# Patient Record
Sex: Female | Born: 1945 | Race: White | Hispanic: No | Marital: Married | Smoking: Never smoker
Health system: Southern US, Community
[De-identification: ages and names within clinical notes are randomized; demographics above are authoritative.]

## PROBLEM LIST (undated history)

## (undated) DIAGNOSIS — I10 Essential (primary) hypertension: Secondary | ICD-10-CM

## (undated) HISTORY — PX: LEG SURGERY: SHX1003

---

## 2017-03-14 ENCOUNTER — Other Ambulatory Visit: Payer: Self-pay | Admitting: Internal Medicine

## 2017-03-14 DIAGNOSIS — Z1231 Encounter for screening mammogram for malignant neoplasm of breast: Secondary | ICD-10-CM

## 2017-06-26 ENCOUNTER — Encounter: Payer: Self-pay | Admitting: Radiology

## 2017-06-26 ENCOUNTER — Ambulatory Visit
Admission: RE | Admit: 2017-06-26 | Discharge: 2017-06-26 | Disposition: A | Discharge status: Home / Self Care | Payer: Medicare Other | Source: Ambulatory Visit | Attending: Internal Medicine | Admitting: Internal Medicine

## 2017-06-26 DIAGNOSIS — Z1231 Encounter for screening mammogram for malignant neoplasm of breast: Secondary | ICD-10-CM | POA: Insufficient documentation

## 2017-07-04 ENCOUNTER — Inpatient Hospital Stay
Admission: RE | Admit: 2017-07-04 | Discharge: 2017-07-04 | Disposition: A | Discharge status: Home / Self Care | Payer: Self-pay | Source: Ambulatory Visit | Attending: *Deleted | Admitting: *Deleted

## 2017-07-04 ENCOUNTER — Other Ambulatory Visit: Payer: Self-pay | Admitting: *Deleted

## 2017-07-04 DIAGNOSIS — Z9289 Personal history of other medical treatment: Secondary | ICD-10-CM

## 2018-08-03 ENCOUNTER — Other Ambulatory Visit: Payer: Self-pay | Admitting: Internal Medicine

## 2018-08-03 DIAGNOSIS — Z1231 Encounter for screening mammogram for malignant neoplasm of breast: Secondary | ICD-10-CM

## 2018-09-14 ENCOUNTER — Other Ambulatory Visit: Payer: Self-pay

## 2018-09-14 ENCOUNTER — Ambulatory Visit
Admission: RE | Admit: 2018-09-14 | Discharge: 2018-09-14 | Disposition: A | Discharge status: Home / Self Care | Payer: Medicare Other | Source: Ambulatory Visit | Attending: Internal Medicine | Admitting: Internal Medicine

## 2018-09-14 DIAGNOSIS — Z1231 Encounter for screening mammogram for malignant neoplasm of breast: Secondary | ICD-10-CM | POA: Insufficient documentation

## 2019-03-23 ENCOUNTER — Ambulatory Visit: Payer: Medicare Other

## 2019-04-01 ENCOUNTER — Ambulatory Visit: Payer: Medicare Other | Attending: Internal Medicine

## 2019-04-01 DIAGNOSIS — Z23 Encounter for immunization: Secondary | ICD-10-CM | POA: Insufficient documentation

## 2019-04-01 NOTE — Progress Notes (Signed)
   Covid-19 Vaccination Clinic  Name:  Robin Douglas    MRN: [id number] DOB: 15-Jul-1945  04/01/2019  Robin Douglas was observed post Covid-19 immunization for 15 minutes without incidence. She was provided with Vaccine Information Sheet and instruction to access the V-Safe system.   Robin Douglas was instructed to call 911 with any severe reactions post vaccine: Marland Kitchen Difficulty breathing  . Swelling of your face and throat  . A fast heartbeat  . A bad rash all over your body  . Dizziness and weakness    Immunizations Administered    Name Date Dose VIS Date Route   Pfizer COVID-19 Vaccine 04/01/2019  5:35 PM 0.3 mL 02/05/2019 Intramuscular   Manufacturer: [employer], [employer]   Lot: [id number]   NDC: [address]-9

## 2019-04-09 ENCOUNTER — Ambulatory Visit: Payer: Medicare Other

## 2019-04-27 ENCOUNTER — Ambulatory Visit: Payer: Medicare Other | Attending: Internal Medicine

## 2019-04-27 DIAGNOSIS — Z23 Encounter for immunization: Secondary | ICD-10-CM | POA: Insufficient documentation

## 2019-04-27 NOTE — Progress Notes (Signed)
   Covid-19 Vaccination Clinic  Name:  Robin Douglas    MRN: [id number] DOB: 1945-11-17  04/27/2019  Ms. Robin Douglas was observed post Covid-19 immunization for 15 minutes without incident. She was provided with Vaccine Information Sheet and instruction to access the V-Safe system.   Ms. Robin Douglas was instructed to call 911 with any severe reactions post vaccine: Marland Kitchen Difficulty breathing  . Swelling of face and throat  . A fast heartbeat  . A bad rash all over body  . Dizziness and weakness   Immunizations Administered    Name Date Dose VIS Date Route   Pfizer COVID-19 Vaccine 04/27/2019  8:30 AM 0.3 mL 02/05/2019 Intramuscular   Manufacturer: [employer], [employer]   Lot: [id number]   NDC: [address]-4

## 2019-06-24 IMAGING — MG MM DIGITAL SCREENING BILAT W/ TOMO W/ CAD
8 series · 8 of 24 positions shown · non-contrast
Comparison: Previous exam(s).

CLINICAL DATA: Screening.

EXAM:
DIGITAL SCREENING BILATERAL MAMMOGRAM WITH TOMO AND CAD

[L CC synth-2D]
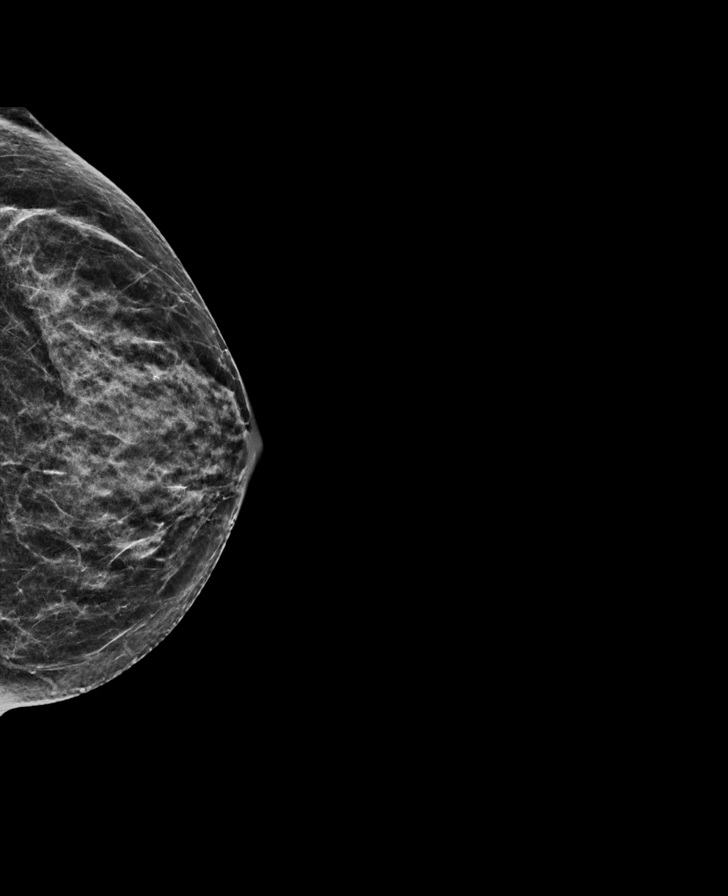

[R CC synth-2D]
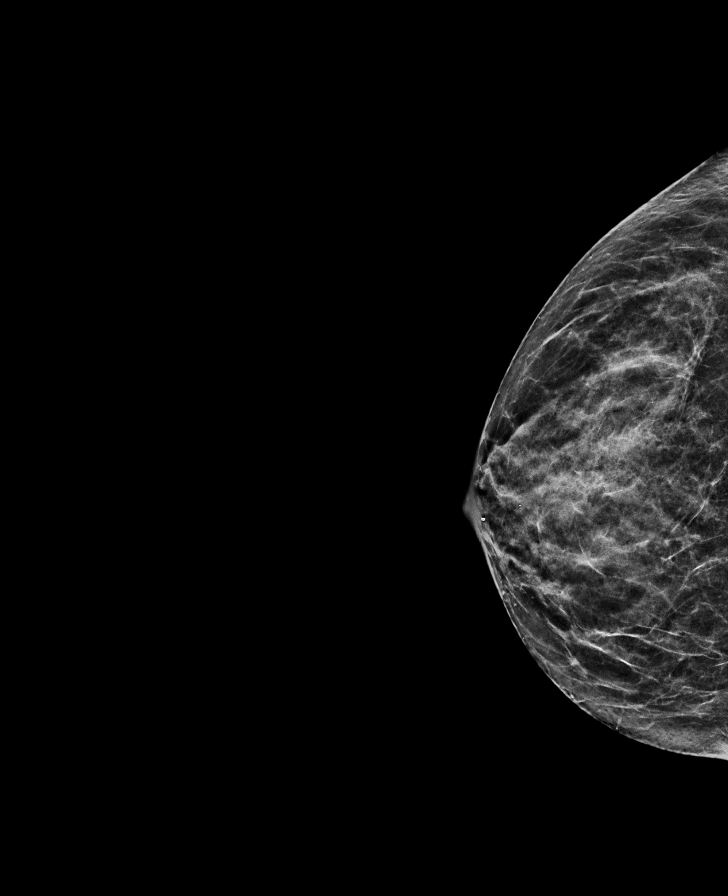

[R MLO synth-2D]
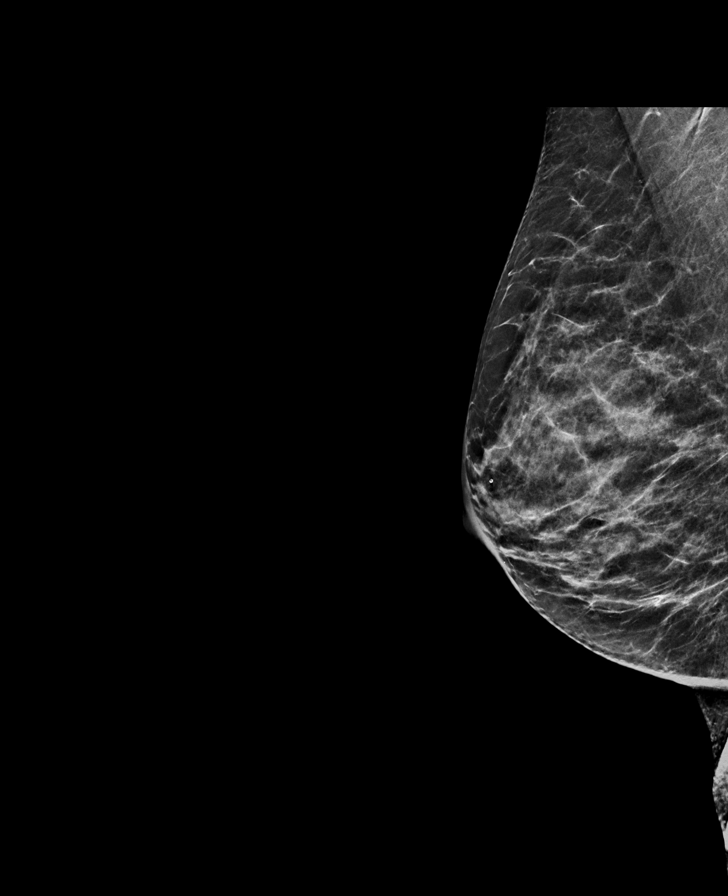

[L MLO synth-2D]
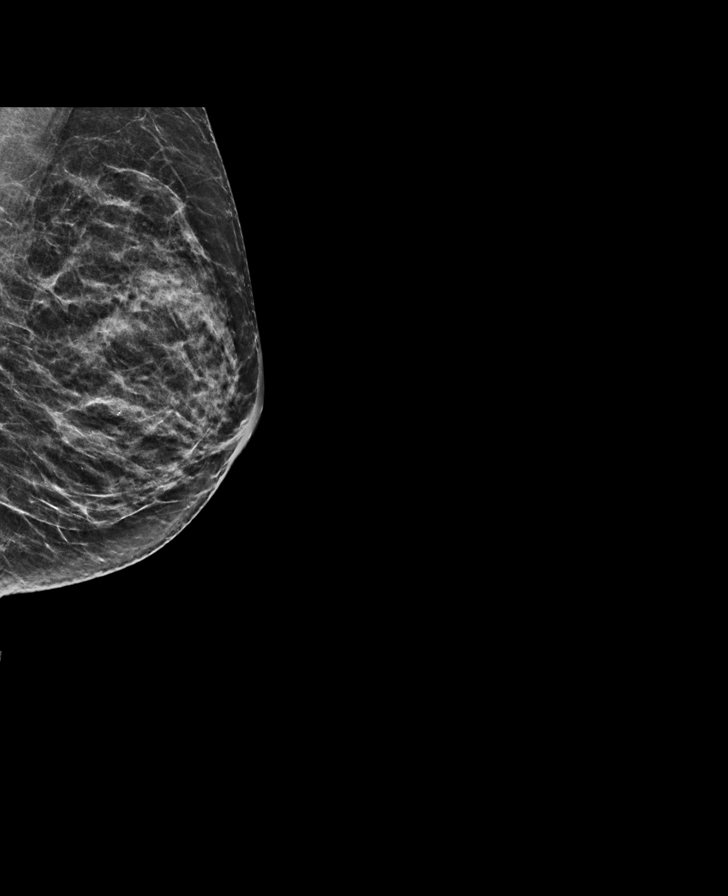

[R MLO tomo · tomo slice 27/53.0]
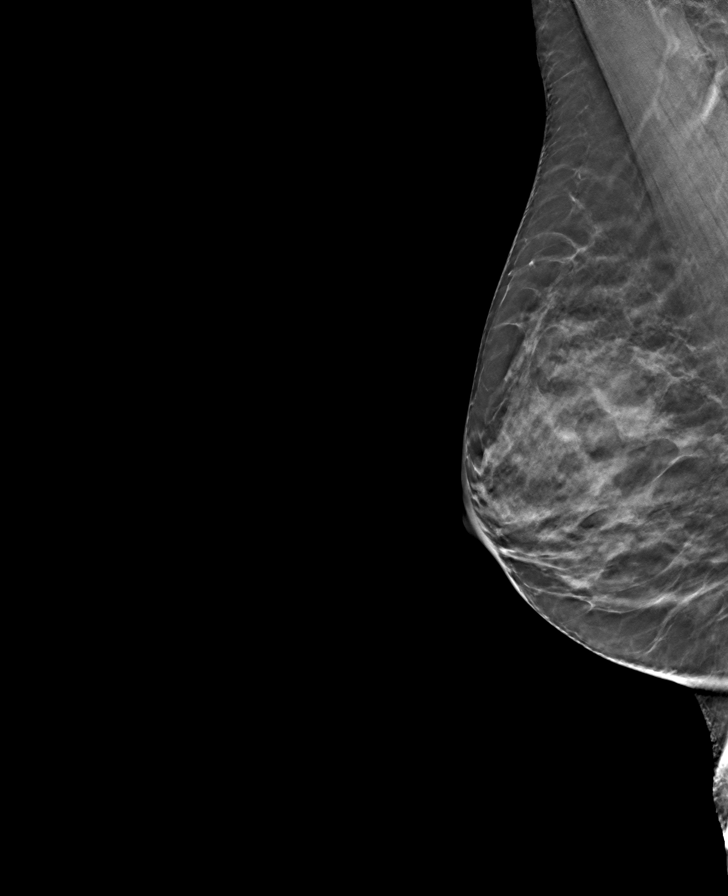

[L MLO tomo · tomo slice 26/51.0]
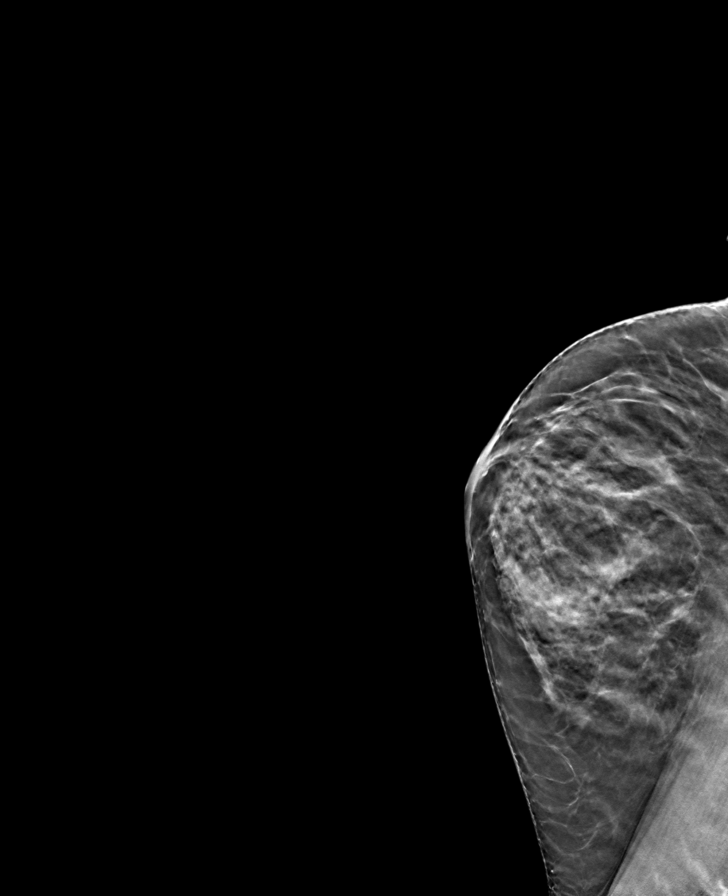

[L CC tomo · tomo slice 27/52.0]
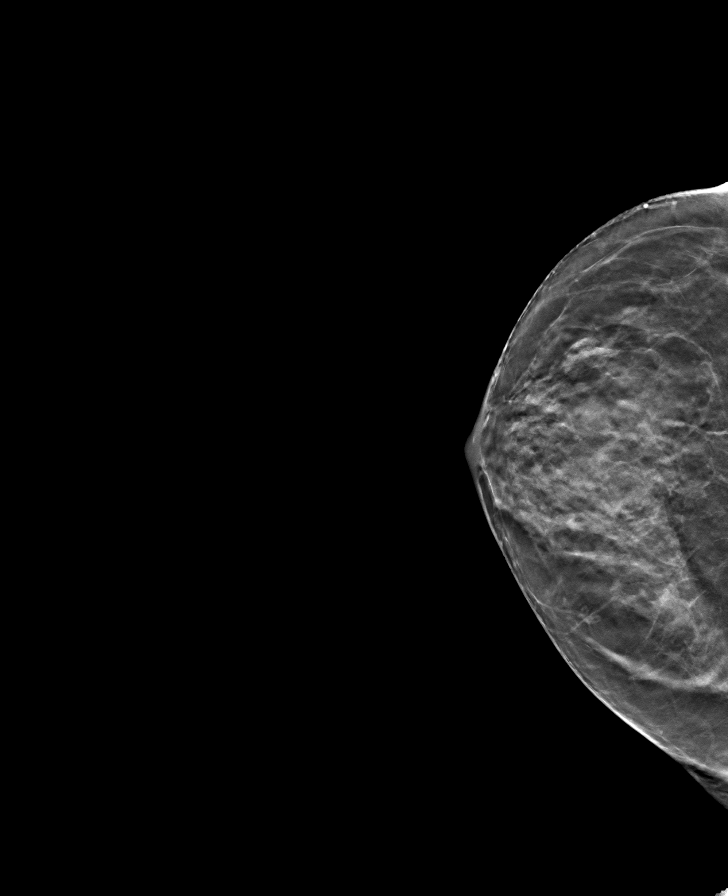

[R CC tomo · tomo slice 27/52.0]
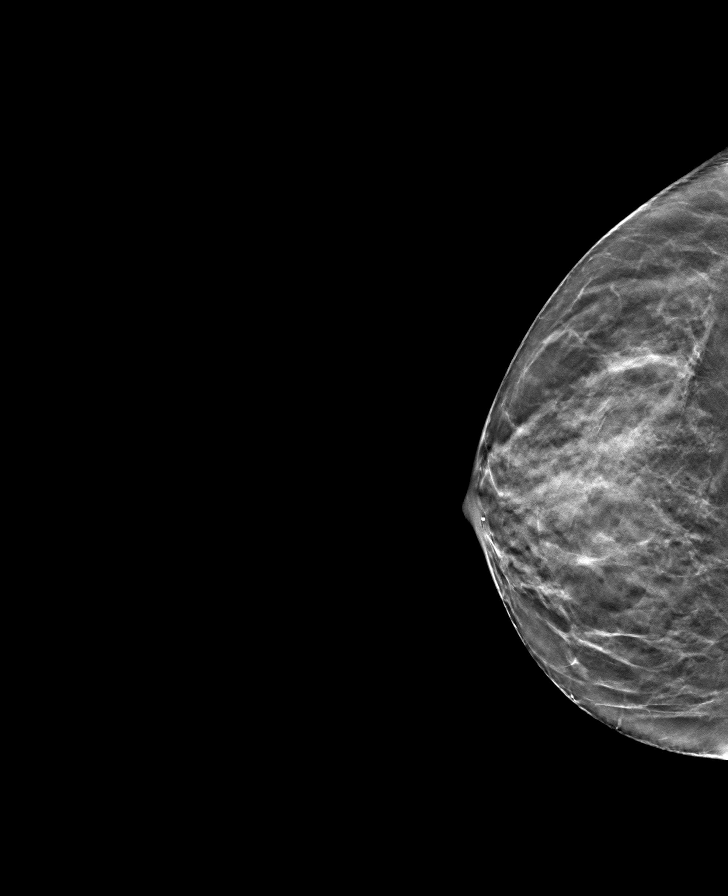

[8 of 24 positions shown; findings below may reference images not displayed]

ACR Breast Density Category b: There are scattered areas of
fibroglandular density.
FINDINGS: There are no findings suspicious for malignancy. Images were
processed with CAD.
IMPRESSION: No mammographic evidence of malignancy. A result letter of this
screening mammogram will be mailed directly to the patient.

RECOMMENDATION:
Screening mammogram in one year. (Code:[id number])

BI-RADS CATEGORY  1: Negative.

## 2019-09-17 ENCOUNTER — Other Ambulatory Visit: Payer: Self-pay | Admitting: Internal Medicine

## 2019-09-17 DIAGNOSIS — Z1231 Encounter for screening mammogram for malignant neoplasm of breast: Secondary | ICD-10-CM

## 2019-10-11 ENCOUNTER — Ambulatory Visit
Admission: RE | Admit: 2019-10-11 | Discharge: 2019-10-11 | Disposition: A | Discharge status: Home / Self Care | Payer: Medicare Other | Source: Ambulatory Visit | Attending: Internal Medicine | Admitting: Internal Medicine

## 2019-10-11 ENCOUNTER — Other Ambulatory Visit: Payer: Self-pay

## 2019-10-11 DIAGNOSIS — Z1231 Encounter for screening mammogram for malignant neoplasm of breast: Secondary | ICD-10-CM | POA: Diagnosis present

## 2019-12-01 ENCOUNTER — Ambulatory Visit
Admission: EM | Admit: 2019-12-01 | Discharge: 2019-12-01 | Disposition: A | Discharge status: Home / Self Care | Payer: Medicare Other

## 2019-12-01 ENCOUNTER — Other Ambulatory Visit: Payer: Self-pay

## 2019-12-01 DIAGNOSIS — R42 Dizziness and giddiness: Secondary | ICD-10-CM

## 2019-12-01 DIAGNOSIS — H65192 Other acute nonsuppurative otitis media, left ear: Secondary | ICD-10-CM | POA: Diagnosis not present

## 2019-12-01 DIAGNOSIS — R0981 Nasal congestion: Secondary | ICD-10-CM | POA: Diagnosis not present

## 2019-12-01 HISTORY — DX: Essential (primary) hypertension: I10

## 2019-12-01 MED ORDER — AMOXICILLIN-POT CLAVULANATE 875-125 MG PO TABS: 1.0000 | ORAL_TABLET | Freq: Two times a day (BID) | ORAL | 0 refills | Status: DC

## 2019-12-01 MED ORDER — AMOXICILLIN-POT CLAVULANATE 875-125 MG PO TABS: 1.0000 | ORAL_TABLET | Freq: Two times a day (BID) | ORAL | 0 refills | Status: AC

## 2019-12-01 NOTE — ED Triage Notes (Signed)
Patient complains of left ear fullness x 1 week. States that she cannot hear out of her ear.

## 2019-12-01 NOTE — ED Provider Notes (Signed)
[facility]   [id number] 12/01/19 Arrival Time: 1236  CC: EAR PAIN  SUBJECTIVE: History from: patient.  Robin Douglas is a 74 y.o. female who presents with of left ear pain and intermittent dizziness for the last week. Denies a precipitating event, such as swimming or wearing ear plugs. Patient states the pain is constant and achy in character. Patient has taken Symptoms are made worse with lying down. Denies similar symptoms in the past. Denies fever, chills, fatigue, sinus pain, rhinorrhea, ear discharge, sore throat, SOB, wheezing, chest pain, nausea, changes in bowel or bladder habits.    ROS: As per HPI.  All other pertinent ROS negative.     Past Medical History:  Diagnosis Date   Hypertension    Past Surgical History:  Procedure Laterality Date   LEG SURGERY     due to polio   No Known Allergies No current facility-administered medications on file prior to encounter.   Current Outpatient Medications on File Prior to Encounter  Medication Sig Dispense Refill   alendronate (FOSAMAX) 70 MG tablet alendronate 70 mg tablet     atorvastatin (LIPITOR) 20 MG tablet Take 1 tablet by mouth daily.     Cholecalciferol 25 MCG (1000 UT) capsule Take by mouth.     hydrocortisone 2.5 % cream Apply topically.     Methylcellulose, Laxative, (CITRUCEL PO) Citrucel     metoprolol succinate (TOPROL-XL) 25 MG 24 hr tablet Take 1 tablet by mouth daily.     milk thistle 175 MG tablet Take 175 mg by mouth daily.     Omega-3 1000 MG CAPS Omega 3     Probiotic Product (ALIGN PO) Align     SUMAtriptan (IMITREX) 50 MG tablet sumatriptan 50 mg tablet     Social History   Socioeconomic History   Marital status: Married    Spouse name: Not on file   Number of children: Not on file   Years of education: Not on file   Highest education level: Not on file  Occupational History   Not on file  Tobacco Use   Smoking status: Never Smoker   Smokeless tobacco: Never Used    Vaping Use   Vaping Use: Never used  Substance and Sexual Activity   Alcohol use: Yes    Comment: wine daily   Drug use: Never   Sexual activity: Not on file  Other Topics Concern   Not on file  Social History Narrative   Not on file   Social Determinants of Health   Financial Resource Strain:    Difficulty of Paying Living Expenses: Not on file  Food Insecurity:    Worried About Running Out of Food in the Last Year: Not on file   Ran Out of Food in the Last Year: Not on file  Transportation Needs:    Lack of Transportation (Medical): Not on file   Lack of Transportation (Non-Medical): Not on file  Physical Activity:    Days of Exercise per Week: Not on file   Minutes of Exercise per Session: Not on file  Stress:    Feeling of Stress : Not on file  Social Connections:    Frequency of Communication with Friends and Family: Not on file   Frequency of Social Gatherings with Friends and Family: Not on file   Attends Religious Services: Not on file   Active Member of Clubs or Organizations: Not on file   Attends Banker Meetings: Not on file   Marital Status:  Not on file  Intimate Partner Violence:    Fear of Current or Ex-Partner: Not on file   Emotionally Abused: Not on file   Physically Abused: Not on file   Sexually Abused: Not on file   Family History  Problem Relation Age of Onset   Breast cancer Paternal Aunt    Breast cancer Cousin        maternal 1st first    OBJECTIVE:  Vitals:   12/01/19 1243 12/01/19 1248  BP:  (!) 155/78  Pulse:  71  Resp:  18  Temp:  98 F (36.7 C)  TempSrc:  Oral  SpO2:  97%  Weight: 136 lb (61.7 kg)   Height: 5\' 3"  (1.6 m)      General appearance: alert; appears fatigued HEENT: Ears: EACs clear, R TM pearly gray with visible cone of light, without erythema, L TM erythematous, bulging, with effusion; Eyes: PERRL, EOMI grossly; Sinuses nontender to palpation; Nose: clear rhinorrhea;  Throat: oropharynx mildly erythematous, tonsils 1+ without white tonsillar exudates, uvula midline Neck: supple without LAD Lungs: unlabored respirations, symmetrical air entry; cough: absent; no respiratory distress Heart: regular rate and rhythm.  Radial pulses 2+ symmetrical bilaterally Skin: warm and dry Psychological: alert and cooperative; normal mood and affect  Imaging: No results found.   ASSESSMENT & PLAN:  1. Other non-recurrent acute nonsuppurative otitis media of left ear   2. Nasal congestion   3. Dizziness and giddiness     Meds ordered this encounter  Medications   DISCONTD: amoxicillin-clavulanate (AUGMENTIN) 875-125 MG tablet    Sig: Take 1 tablet by mouth 2 (two) times daily for 10 days.    Dispense:  20 tablet    Refill:  0    Order Specific Question:   Supervising Provider    Answer:   Merrilee Jansky   amoxicillin-clavulanate (AUGMENTIN) 875-125 MG tablet    Sig: Take 1 tablet by mouth 2 (two) times daily for 7 days.    Dispense:  14 tablet    Refill:  0    Order Specific Question:   Supervising Provider    Answer:   [id number] Merrilee Jansky    Rest and drink plenty of fluids Prescribed augmentin 875 for 7 days Take medications as directed and to completion Continue to use OTC ibuprofen and/ or tylenol as needed for pain control Follow up with PCP if symptoms persists Return here or go to the ER if you have any new or worsening symptoms   Reviewed expectations re: course of current medical issues. Questions answered. Outlined signs and symptoms indicating need for more acute intervention. Patient verbalized understanding. After Visit Summary given.         [[id number]], NP 12/01/19 1401

## 2019-12-01 NOTE — Discharge Instructions (Addendum)
You have an ear infection  I have sent in Augmentin for you to take twice a day for 7 days  Follow up with this office or with primary care if symptoms are persisting.  Follow up with the ER for high fever, trouble swallowing, trouble breathing, other concerning symptoms

## 2020-10-02 ENCOUNTER — Other Ambulatory Visit: Payer: Self-pay | Admitting: Internal Medicine

## 2020-10-02 DIAGNOSIS — Z1231 Encounter for screening mammogram for malignant neoplasm of breast: Secondary | ICD-10-CM

## 2020-10-16 ENCOUNTER — Ambulatory Visit
Admission: RE | Admit: 2020-10-16 | Discharge: 2020-10-16 | Disposition: A | Discharge status: Home / Self Care | Payer: Medicare Other | Source: Ambulatory Visit | Attending: Internal Medicine | Admitting: Internal Medicine

## 2020-10-16 ENCOUNTER — Other Ambulatory Visit: Payer: Self-pay

## 2020-10-16 DIAGNOSIS — Z1231 Encounter for screening mammogram for malignant neoplasm of breast: Secondary | ICD-10-CM | POA: Insufficient documentation

## 2021-09-21 ENCOUNTER — Other Ambulatory Visit: Payer: Self-pay | Admitting: Internal Medicine

## 2021-09-21 DIAGNOSIS — Z1231 Encounter for screening mammogram for malignant neoplasm of breast: Secondary | ICD-10-CM

## 2021-10-22 ENCOUNTER — Ambulatory Visit
Admission: RE | Admit: 2021-10-22 | Discharge: 2021-10-22 | Disposition: A | Discharge status: Home / Self Care | Payer: Medicare Other | Source: Ambulatory Visit | Attending: Internal Medicine | Admitting: Internal Medicine

## 2021-10-22 DIAGNOSIS — Z1231 Encounter for screening mammogram for malignant neoplasm of breast: Secondary | ICD-10-CM | POA: Insufficient documentation

## 2022-09-17 ENCOUNTER — Ambulatory Visit (INDEPENDENT_AMBULATORY_CARE_PROVIDER_SITE_OTHER): Payer: Medicare Other | Admitting: Professional Counselor

## 2022-09-17 ENCOUNTER — Encounter: Payer: Self-pay | Admitting: Professional Counselor

## 2022-09-17 DIAGNOSIS — F4323 Adjustment disorder with mixed anxiety and depressed mood: Secondary | ICD-10-CM | POA: Diagnosis not present
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
## 2022-10-07 ENCOUNTER — Other Ambulatory Visit: Payer: Self-pay | Admitting: Internal Medicine

## 2022-10-07 DIAGNOSIS — Z1231 Encounter for screening mammogram for malignant neoplasm of breast: Secondary | ICD-10-CM

## 2022-10-16 ENCOUNTER — Ambulatory Visit: Payer: Medicare Other | Admitting: Professional Counselor

## 2022-10-30 ENCOUNTER — Ambulatory Visit
Admission: RE | Admit: 2022-10-30 | Discharge: 2022-10-30 | Disposition: A | Discharge status: Home / Self Care | Payer: Medicare Other | Source: Ambulatory Visit | Attending: Internal Medicine | Admitting: Internal Medicine

## 2022-10-30 DIAGNOSIS — Z1231 Encounter for screening mammogram for malignant neoplasm of breast: Secondary | ICD-10-CM | POA: Insufficient documentation

## 2022-11-06 ENCOUNTER — Ambulatory Visit (INDEPENDENT_AMBULATORY_CARE_PROVIDER_SITE_OTHER): Payer: Medicare Other | Admitting: Professional Counselor

## 2022-11-06 ENCOUNTER — Encounter: Payer: Self-pay | Admitting: Professional Counselor

## 2022-11-06 DIAGNOSIS — F4323 Adjustment disorder with mixed anxiety and depressed mood: Secondary | ICD-10-CM | POA: Diagnosis not present

## 2022-11-06 NOTE — Progress Notes (Addendum)
      [facility] Counselor/Therapist Progress Note  Patient ID: Robin Douglas, MRN: [id number],    Date: 11/06/2022  Time Spent: 9:08a - 10:04a   Treatment Type: Individual Therapy  Reported Symptoms: worries, restelessness, sleeplessness, trouble concentrating, stress, caregiver strain, interpersonal concerns  Mental Status Exam:  Appearance:   Neat     Behavior:  Appropriate, Sharing, and Motivated  Motor:  Normal  Speech/Language:   Clear and Coherent and Normal Rate  Affect:  Appropriate and Congruent  Mood:  normal  Thought process:  normal  Thought content:    WNL  Sensory/Perceptual disturbances:    WNL  Orientation:  oriented to person, place, time/date, and situation  Attention:  Good  Concentration:  Good  Memory:  WNL  Fund of knowledge:   Good  Insight:    Good  Judgment:   Good  Impulse Control:  Good   Risk Assessment: Danger to Self:  No Self-injurious Behavior: No Danger to Others: No Duty to Warn:no Physical Aggression / Violence:No  Access to Firearms a concern: No  Gang Involvement:No   Subjective: Pt presented to session voicing improvements with caregiver scenario in some ways and ongoing concerns in others. She reported firearm in home to have been secured away from home and to feel safer as a result, given spouse's judgement at this time. She reported having a positive vacation with him with low incidence of symptomology on his part which provided her with measure of respite. She processed experience of ongoing management of his health ,and overall wellness concerns such as exercise, social and other endeavors. Counselor and pt discussed pt strategies around enhancing connection and enrichment for spouse, and for self, and as relates her self care in circumstance. Pt voiced church as challenging for husband in terms of his fatigue and motivation; counselor suggested Bible Study in the home to nurture objectives, and limit potential motivation issues. They  discussed plan for ongoing care, and the couple's plans and navigation along those lines; pt voiced intention to pursue residential options. Counselor actively listened, affirmed pt, helped facilitate insight, and resource pt strengths and external supports. Counselor and pt dicussed pt treatment plan and pt gave her consent.   Interventions: Solution-Oriented/Positive Psychology, Humanistic/Existential, and Insight-Oriented  Diagnosis:   ICD-10-CM   1. Adjustment disorder with mixed anxiety and depressed mood  F43.23       Plan: Pt is scheduled for a follow-up; continue process work and developing coping skills.   Gaspar Bidding, [facility]

## 2022-11-13 ENCOUNTER — Ambulatory Visit: Payer: Medicare Other | Admitting: Professional Counselor

## 2022-11-25 ENCOUNTER — Encounter: Payer: Self-pay | Admitting: Professional Counselor

## 2022-11-25 ENCOUNTER — Ambulatory Visit (INDEPENDENT_AMBULATORY_CARE_PROVIDER_SITE_OTHER): Payer: Medicare Other | Admitting: Professional Counselor

## 2022-11-25 DIAGNOSIS — F4323 Adjustment disorder with mixed anxiety and depressed mood: Secondary | ICD-10-CM

## 2022-11-25 NOTE — Progress Notes (Signed)
      [facility] Counselor/Therapist Progress Note  Patient ID: Robin Douglas, MRN: [id number],    Date: 11/25/2022  Time Spent: 9:11a - 10:08a   Treatment Type: Individual Therapy  Reported Symptoms: caregiver strain, stress, worries  Mental Status Exam:  Appearance:   Neat     Behavior:  Appropriate, Sharing, and Motivated  Motor:  Normal  Speech/Language:   Clear and Coherent and Normal Rate  Affect:  Appropriate and Congruent  Mood:  normal  Thought process:  normal  Thought content:    WNL  Sensory/Perceptual disturbances:    WNL  Orientation:  oriented to person and situation  Attention:  Good  Concentration:  Good  Memory:  WNL  Fund of knowledge:   Good  Insight:    Good  Judgment:   Good  Impulse Control:  Good   Risk Assessment: Danger to Self:  No Self-injurious Behavior: No Danger to Others: No Duty to Warn:no Physical Aggression / Violence:No  Access to Firearms a concern: No  Gang Involvement:No   Subjective: Pt presented to session voicing fall season to be helpful with husband's care and symptoms, as scheduled activities have correlated with the season change. She processed experience of his symptoms and developments, and voiced frustration with his care plan at this time. Counselor encouraged advocacy and provided pt with resources including "36-hour Day" book recommendation, and [place] [employer]. Pt voiced adapting to grief and loss in the situation and hopeful regarding potential transition to retirement community. Counselor encouraged small, slow outings such as park and lake visits to minimize pt spouse confusion and frustration with cognitively demanding activities, and therefore limit pt resulting challenges, and to resource increased restfulness and relaxation for both.    Interventions: Solution-Oriented/Positive Psychology, Humanistic/Existential, and Insight-Oriented  Diagnosis:   ICD-10-CM   1. Adjustment disorder  with mixed anxiety and depressed mood  F43.23       Plan: Pt is scheduled for a follow-up; continue process work and developing coping skills.   Gaspar Bidding, [facility]

## 2022-12-05 ENCOUNTER — Ambulatory Visit: Payer: Medicare Other | Admitting: Professional Counselor

## 2022-12-09 ENCOUNTER — Ambulatory Visit: Payer: Medicare Other | Admitting: Professional Counselor

## 2022-12-23 ENCOUNTER — Ambulatory Visit: Payer: Medicare Other | Admitting: Professional Counselor

## 2023-01-09 ENCOUNTER — Encounter: Payer: Self-pay | Admitting: Professional Counselor

## 2023-01-09 ENCOUNTER — Ambulatory Visit: Payer: Medicare Other | Admitting: Professional Counselor

## 2023-01-09 DIAGNOSIS — F4323 Adjustment disorder with mixed anxiety and depressed mood: Secondary | ICD-10-CM

## 2023-01-09 NOTE — Progress Notes (Signed)
      [facility] Counselor/Therapist Progress Note  Patient ID: Robin Douglas, MRN: [id number],    Date: 01/09/2023  Time Spent: 9:14a - 10:12a   Treatment Type: Individual Therapy  Reported Symptoms: worries, irritability, caregiver strain, ambiguous loss, interpersonal concerns   Mental Status Exam:  Appearance:   Neat     Behavior:  Appropriate, Sharing, and Motivated  Motor:  Normal  Speech/Language:   Clear and Coherent and Normal Rate  Affect:  Appropriate and Congruent  Mood:  normal  Thought process:  normal  Thought content:    WNL  Sensory/Perceptual disturbances:    WNL  Orientation:  Sound  Attention:  Good  Concentration:  Good  Memory:  WNL  Fund of knowledge:   Good  Insight:    Good  Judgment:   Good  Impulse Control:  Good   Risk Assessment: Danger to Self:  No Self-injurious Behavior: No Danger to Others: No Duty to Warn:no Physical Aggression / Violence:No  Access to Firearms a concern: No  Gang Involvement:No   Subjective: Pt presented to session to address anxiety and depressive symptoms as relate husband's state of health and caregiver strain. She reported progress coping, having joined a caregiver support group as discussed last session.She also inquired regarding retirement community openings as discussed. Pt identified STG between this and next session as working on necessary paperwork tasks. Counselor and pt discussed ambiguous loss experience for pt, and strategies for reframing her perspective of the challenges she and spouse face. 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 She reflected on she and husband's happy blended family and expressed gratitude for the strength and support they provide.  Interventions: Solution-Oriented/Positive Psychology, Humanistic/Existential, and Insight-Oriented, Supportive Therapy, Psychoeducation, Grief Therapy  Diagnosis:   ICD-10-CM   1. Adjustment disorder with mixed anxiety  and depressed mood  F43.23       Plan: Pt is scheduled for a follow-up; continue process work and developing coping skills. Pt to address STG between sessions as identified above.   Gaspar Bidding, [facility]

## 2023-02-10 ENCOUNTER — Ambulatory Visit: Payer: Medicare Other | Admitting: Professional Counselor

## 2023-03-10 ENCOUNTER — Encounter: Payer: Self-pay | Admitting: Professional Counselor

## 2023-03-10 ENCOUNTER — Ambulatory Visit: Payer: Medicare Other | Admitting: Professional Counselor

## 2023-03-10 DIAGNOSIS — F4323 Adjustment disorder with mixed anxiety and depressed mood: Secondary | ICD-10-CM

## 2023-03-10 NOTE — Progress Notes (Signed)
      [facility] Counselor/Therapist Progress Note  Patient ID: Robin Douglas, MRN: [id number],    Date: 03/10/2023  Time Spent: 9:13 AM to 10:08 AM  Treatment Type: Individual Therapy  Reported Symptoms: Worries, irritability, caregiver strain, stress, grief/loss, phase of life concerns  Mental Status Exam:  Appearance:   Neat     Behavior:  Appropriate, Sharing, and Motivated  Motor:  Shuffling Gait due to medical condition  Speech/Language:   Clear and Coherent and Normal Rate  Affect:  Appropriate and Congruent  Mood:  normal  Thought process:  normal  Thought content:    WNL  Sensory/Perceptual disturbances:    WNL  Orientation:  oriented to person, place, time/date, and situation  Attention:  Good  Concentration:  Good  Memory:  WNL  Fund of knowledge:   Good  Insight:    Good  Judgment:   Good  Impulse Control:  Good   Risk Assessment: Danger to Self:  No Self-injurious Behavior: No Danger to Others: No Duty to Warn:no Physical Aggression / Violence:No  Access to Firearms a concern: No  Gang Involvement:No   Subjective: Patient presented to session to address concerns of anxiety and depression.  She reported mixed progress at this time.  Patient reported continuing to attend her caregiver support group and continuing to look into retirement home options, and finding helpfulness and hopefulness respectively.  She processed experience of a friend having broken leg, and another friend passing away.  She processed grief and loss and transitions related to phase of life.  Patient expressed her leg as hurting more recently, and worries regarding her caregiving role if she herself is compromised in health.  Patient processed experience of husband's experience of dementia-related symptoms.  She voiced looking forward to an upcoming special family event, and voiced that she is prioritizing social outlets for herself and her husband, trying her best to work with his motivation  that ebbs and flows.  Counselor actively listened, held space for grief and loss processing, affirmed patient feelings and experience, and helped patient discern regarding decisions that are on her mind, and helped to instill hope.  Interventions: Solution-Oriented/Positive Psychology, Humanistic/Existential, and Insight-Oriented  Diagnosis:   ICD-10-CM   1. Adjustment disorder with mixed anxiety and depressed mood  F43.23       Plan: Patient is scheduled for follow-up; continue process work and developing coping skills.  Patient to address short-term goal between sessions of continuing to utilize her support system and outlets, and continue to stay the course of looking into residence options.  Progress note was dictated with Dragon and reviewed for accuracy.  Robin Douglas, [facility]

## 2023-04-07 ENCOUNTER — Ambulatory Visit: Payer: Medicare Other | Admitting: Professional Counselor

## 2023-04-07 ENCOUNTER — Encounter: Payer: Self-pay | Admitting: Professional Counselor

## 2023-04-07 DIAGNOSIS — F4323 Adjustment disorder with mixed anxiety and depressed mood: Secondary | ICD-10-CM | POA: Diagnosis not present

## 2023-04-07 NOTE — Progress Notes (Signed)
      [facility] Counselor/Therapist Progress Note  Patient ID: Robin Douglas, MRN: [id number],    Date: 04/07/2023  Time Spent: 9:16 AM to 10:01 AM  Treatment Type: Individual Therapy  Reported Symptoms: Caregiver strain, worries, somatic pain (knee) and related frustration, stress  Mental Status Exam:  Appearance:   Neat     Behavior:  Appropriate, Sharing, and Motivated  Motor:  Normal  Speech/Language:   Clear and Coherent and Normal Rate  Affect:  Appropriate and Congruent  Mood:  normal  Thought process:  normal  Thought content:    WNL  Sensory/Perceptual disturbances:    WNL  Orientation:  oriented to person, place, time/date, and situation  Attention:  Good  Concentration:  Good  Memory:  WNL  Fund of knowledge:   Good  Insight:    Good  Judgment:   Good  Impulse Control:  Good   Risk Assessment: Danger to Self:  No Self-injurious Behavior: No Danger to Others: No Duty to Warn:no Physical Aggression / Violence:No  Access to Firearms a concern: No  Gang Involvement:No   Subjective: Patient presented to session with concerns of anxiety and depression.  She reported progress.  She reported acclimating by and by to caregiving circumstance with husband, and for her caregiver support group to be helpful.  She processed experience of husband's dementia, and counselor helped give psychoeducation regarding concerns.  They discussed how husband remembering details is not as important as forging connections, and that it is the human exchange that is of value and not as much the specifics of memory.  Patient voiced experience of positive outings with family, and having been looking into retirement communities including assisted living and memory care options.  Counselor and patient discussed patient progress and stability at this time, and patient intention to follow-up in June of this year.  Interventions: Solution-Oriented/Positive Psychology, Humanistic/Existential, and  Insight-Oriented  Diagnosis:   ICD-10-CM   1. Adjustment disorder with mixed anxiety and depressed mood  F43.23       Plan: Patient is scheduled for follow-up in June; continue process work and Conservation officer, historic buildings.  Patient short-term goal between sessions in the next several months to continue to work on retirement home plan, continue with caregiver support group, and continue to resource support through family and friends and medical team for husband.  Progress note was dictated with Dragon and reviewed for accuracy.  Gaspar Bidding, [facility]

## 2023-05-12 ENCOUNTER — Ambulatory Visit: Payer: Medicare Other | Admitting: Professional Counselor

## 2023-05-19 ENCOUNTER — Ambulatory Visit: Payer: Medicare Other | Admitting: Professional Counselor

## 2023-08-04 ENCOUNTER — Ambulatory Visit: Payer: Medicare Other | Admitting: Professional Counselor

## 2023-10-06 ENCOUNTER — Other Ambulatory Visit: Payer: Self-pay | Admitting: Internal Medicine

## 2023-10-06 DIAGNOSIS — Z1231 Encounter for screening mammogram for malignant neoplasm of breast: Secondary | ICD-10-CM

## 2023-11-05 ENCOUNTER — Ambulatory Visit
Admission: RE | Admit: 2023-11-05 | Discharge: 2023-11-05 | Disposition: A | Discharge status: Home / Self Care | Source: Ambulatory Visit | Attending: Internal Medicine | Admitting: Internal Medicine

## 2023-11-05 DIAGNOSIS — Z1231 Encounter for screening mammogram for malignant neoplasm of breast: Secondary | ICD-10-CM | POA: Insufficient documentation
